# Patient Record
Sex: Female | Born: 1995 | Race: Black or African American | Hispanic: No | Marital: Single | Smoking: Never smoker
Health system: Southern US, Community
[De-identification: ages and names within clinical notes are randomized; demographics above are authoritative.]

## PROBLEM LIST (undated history)

## (undated) ENCOUNTER — Inpatient Hospital Stay (HOSPITAL_COMMUNITY): Payer: Self-pay

## (undated) DIAGNOSIS — Z789 Other specified health status: Secondary | ICD-10-CM

## (undated) HISTORY — PX: NO PAST SURGERIES: SHX2092

---
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
## 2016-10-27 ENCOUNTER — Ambulatory Visit: Payer: Self-pay

## 2016-12-21 NOTE — L&D Delivery Note (Signed)
Delivery Note At  a viable female was delivered via  (Presentation:OA ;  ).  APGAR:9 ,9 ; weight  .   Placenta status:complete , . 3V Cord:  with the following complications:compound presentation, chorioamnionitis,  Anesthesia:  Epidural Episiotomy:  None Lacerations:  2nd vaginal , left labial laceration Suture Repair: 2.0 3.0 vicryl Est. Blood Loss (mL):  2000cc With repeat excessive bleeding noted.  1000cc noted.  Placenta inspected and looked intact.  Dr. Shawnie PonsPratt in to help with repair.  Dr. Shawnie PonsPratt did a exploration of uterus and noted placenta piece with extra debride  that was unable to be obtained.   Pt VSS stable. Sponges and laps weighed and noted to be 2000cc total.  Code hemorrhage called.  Dr. Idamae SchullerVarnardo called to come to hospital.  Anesthesia into dose epidural.   Dr. Levora AngelVanardo in at bedside. Pt consent for D and C.    Mom to OR.  Baby to Couplet care / Skin to Skin.  Michelle Santiago 12/15/2017, 1:44 AM

## 2017-02-20 ENCOUNTER — Emergency Department (HOSPITAL_COMMUNITY)
Admission: EM | Admit: 2017-02-20 | Discharge: 2017-02-20 | Disposition: A | Discharge status: Home / Self Care | Payer: 59 | Attending: Emergency Medicine | Admitting: Emergency Medicine

## 2017-02-20 ENCOUNTER — Encounter (HOSPITAL_COMMUNITY): Payer: Self-pay | Admitting: Emergency Medicine

## 2017-02-20 ENCOUNTER — Emergency Department (HOSPITAL_COMMUNITY): Payer: 59

## 2017-02-20 DIAGNOSIS — Y999 Unspecified external cause status: Secondary | ICD-10-CM | POA: Insufficient documentation

## 2017-02-20 DIAGNOSIS — Y92410 Unspecified street and highway as the place of occurrence of the external cause: Secondary | ICD-10-CM | POA: Diagnosis not present

## 2017-02-20 DIAGNOSIS — S39012A Strain of muscle, fascia and tendon of lower back, initial encounter: Secondary | ICD-10-CM | POA: Diagnosis not present

## 2017-02-20 DIAGNOSIS — Z79899 Other long term (current) drug therapy: Secondary | ICD-10-CM | POA: Diagnosis not present

## 2017-02-20 DIAGNOSIS — Y939 Activity, unspecified: Secondary | ICD-10-CM | POA: Diagnosis not present

## 2017-02-20 DIAGNOSIS — S3992XA Unspecified injury of lower back, initial encounter: Secondary | ICD-10-CM | POA: Diagnosis present

## 2017-02-20 DIAGNOSIS — M25511 Pain in right shoulder: Secondary | ICD-10-CM | POA: Diagnosis not present

## 2017-02-20 LAB — POC URINE PREG, ED: PREG TEST UR: NEGATIVE

## 2017-02-20 MED ORDER — DICLOFENAC SODIUM 50 MG PO TBEC: 50.0000 mg | DELAYED_RELEASE_TABLET | Freq: Two times a day (BID) | ORAL | 0 refills | Status: DC

## 2017-02-20 MED ORDER — CYCLOBENZAPRINE HCL 5 MG PO TABS: 5.0000 mg | ORAL_TABLET | Freq: Three times a day (TID) | ORAL | 0 refills | Status: DC | PRN

## 2017-02-20 MED ORDER — CYCLOBENZAPRINE HCL 10 MG PO TABS
5.0000 mg | ORAL_TABLET | Freq: Once | ORAL | Status: AC
  Administered 2017-02-20: 5 mg via ORAL
  Filled 2017-02-20: qty 1
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
## 2017-02-20 NOTE — ED Notes (Signed)
Pt stable, understands discharge instructions, and reasons for return.   

## 2017-02-20 NOTE — Discharge Instructions (Signed)
Do not drive while taking the muscle relaxant as it will make you sleepy. °
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
## 2017-06-08 LAB — OB RESULTS CONSOLE GC/CHLAMYDIA: Gonorrhea: NEGATIVE

## 2017-06-13 LAB — OB RESULTS CONSOLE GC/CHLAMYDIA: Chlamydia: NEGATIVE

## 2017-08-29 LAB — OB RESULTS CONSOLE RPR: RPR: NONREACTIVE

## 2017-09-02 ENCOUNTER — Other Ambulatory Visit (HOSPITAL_COMMUNITY): Payer: Self-pay | Admitting: Obstetrics and Gynecology

## 2017-09-02 DIAGNOSIS — N883 Incompetence of cervix uteri: Secondary | ICD-10-CM

## 2017-09-02 DIAGNOSIS — Z3A24 24 weeks gestation of pregnancy: Secondary | ICD-10-CM

## 2017-09-02 DIAGNOSIS — Z3689 Encounter for other specified antenatal screening: Secondary | ICD-10-CM

## 2017-09-07 ENCOUNTER — Encounter (HOSPITAL_COMMUNITY): Payer: Self-pay | Admitting: *Deleted

## 2017-09-08 LAB — OB RESULTS CONSOLE RUBELLA ANTIBODY, IGM: RUBELLA: IMMUNE

## 2017-09-09 ENCOUNTER — Other Ambulatory Visit (HOSPITAL_COMMUNITY): Payer: Self-pay | Admitting: *Deleted

## 2017-09-09 ENCOUNTER — Ambulatory Visit (HOSPITAL_COMMUNITY): Admission: RE | Admit: 2017-09-09 | Payer: Medicaid Other | Source: Ambulatory Visit

## 2017-09-09 ENCOUNTER — Ambulatory Visit (HOSPITAL_COMMUNITY)
Admission: RE | Admit: 2017-09-09 | Discharge: 2017-09-09 | Disposition: A | Discharge status: Home / Self Care | Payer: Medicaid Other | Source: Ambulatory Visit | Attending: Obstetrics and Gynecology | Admitting: Obstetrics and Gynecology

## 2017-09-09 ENCOUNTER — Encounter (HOSPITAL_COMMUNITY): Payer: Self-pay

## 2017-09-09 ENCOUNTER — Other Ambulatory Visit (HOSPITAL_COMMUNITY): Payer: Self-pay | Admitting: Obstetrics and Gynecology

## 2017-09-09 DIAGNOSIS — O26872 Cervical shortening, second trimester: Secondary | ICD-10-CM | POA: Diagnosis present

## 2017-09-09 DIAGNOSIS — Z3A25 25 weeks gestation of pregnancy: Secondary | ICD-10-CM | POA: Insufficient documentation

## 2017-09-09 DIAGNOSIS — Z3A24 24 weeks gestation of pregnancy: Secondary | ICD-10-CM

## 2017-09-09 DIAGNOSIS — Z3689 Encounter for other specified antenatal screening: Secondary | ICD-10-CM

## 2017-09-09 DIAGNOSIS — N883 Incompetence of cervix uteri: Secondary | ICD-10-CM

## 2017-09-09 HISTORY — DX: Other specified health status: Z78.9

## 2017-09-16 ENCOUNTER — Encounter (HOSPITAL_COMMUNITY): Payer: Self-pay

## 2017-09-16 ENCOUNTER — Inpatient Hospital Stay (HOSPITAL_COMMUNITY): Payer: Medicaid Other

## 2017-09-16 ENCOUNTER — Inpatient Hospital Stay (HOSPITAL_COMMUNITY)
Admission: AD | Admit: 2017-09-16 | Discharge: 2017-09-16 | Disposition: A | Discharge status: Home / Self Care | Payer: Medicaid Other | Source: Ambulatory Visit | Attending: Obstetrics and Gynecology | Admitting: Obstetrics and Gynecology

## 2017-09-16 DIAGNOSIS — Z3A26 26 weeks gestation of pregnancy: Secondary | ICD-10-CM | POA: Insufficient documentation

## 2017-09-16 DIAGNOSIS — O26872 Cervical shortening, second trimester: Secondary | ICD-10-CM | POA: Diagnosis not present

## 2017-09-16 DIAGNOSIS — Z3686 Encounter for antenatal screening for cervical length: Secondary | ICD-10-CM

## 2017-09-16 LAB — URINALYSIS, ROUTINE W REFLEX MICROSCOPIC
BACTERIA UA: NONE SEEN
BILIRUBIN URINE: NEGATIVE
Glucose, UA: 50 mg/dL — AB
HGB URINE DIPSTICK: NEGATIVE
KETONES UR: NEGATIVE mg/dL
NITRITE: NEGATIVE
Protein, ur: NEGATIVE mg/dL
SPECIFIC GRAVITY, URINE: 1.02 (ref 1.005–1.030)
pH: 5 (ref 5.0–8.0)

## 2017-09-16 NOTE — Discharge Instructions (Signed)
Second Trimester of Pregnancy The second trimester is from week 13 through week 28, month 4 through 6. This is often the time in pregnancy that you feel your best. Often times, morning sickness has lessened or quit. You may have more energy, and you may get hungry more often. Your unborn baby (fetus) is growing rapidly. At the end of the sixth month, he or she is about 9 inches long and weighs about 1 pounds. You will likely feel the baby move (quickening) between 18 and 20 weeks of pregnancy. Follow these instructions at home:  Avoid all smoking, herbs, and alcohol. Avoid drugs not approved by your doctor.  Do not use any tobacco products, including cigarettes, chewing tobacco, and electronic cigarettes. If you need help quitting, ask your doctor. You may get counseling or other support to help you quit.  Only take medicine as told by your doctor. Some medicines are safe and some are not during pregnancy.  Exercise only as told by your doctor. Stop exercising if you start having cramps.  Eat regular, healthy meals.  Wear a good support bra if your breasts are tender.  Do not use hot tubs, steam rooms, or saunas.  Wear your seat belt when driving.  Avoid raw meat, uncooked cheese, and liter boxes and soil used by cats.  Take your prenatal vitamins.  Take 1500-2000 milligrams of calcium daily starting at the 20th week of pregnancy until you deliver your baby.  Try taking medicine that helps you poop (stool softener) as needed, and if your doctor approves. Eat more fiber by eating fresh fruit, vegetables, and whole grains. Drink enough fluids to keep your pee (urine) clear or pale yellow.  Take warm water baths (sitz baths) to soothe pain or discomfort caused by hemorrhoids. Use hemorrhoid cream if your doctor approves.  If you have puffy, bulging veins (varicose veins), wear support hose. Raise (elevate) your feet for 15 minutes, 3-4 times a day. Limit salt in your diet.  Avoid heavy  lifting, wear low heals, and sit up straight.  Rest with your legs raised if you have leg cramps or low back pain.  Visit your dentist if you have not gone during your pregnancy. Use a soft toothbrush to brush your teeth. Be gentle when you floss.  You can have sex (intercourse) unless your doctor tells you not to.  Go to your doctor visits. Get help if:  You feel dizzy.  You have mild cramps or pressure in your lower belly (abdomen).  You have a nagging pain in your belly area.  You continue to feel sick to your stomach (nauseous), throw up (vomit), or have watery poop (diarrhea).  You have bad smelling fluid coming from your vagina.  You have pain with peeing (urination). Get help right away if:  You have a fever.  You are leaking fluid from your vagina.  You have spotting or bleeding from your vagina.  You have severe belly cramping or pain.  You lose or gain weight rapidly.  You have trouble catching your breath and have chest pain.  You notice sudden or extreme puffiness (swelling) of your face, hands, ankles, feet, or legs.  You have not felt the baby move in over an hour.  You have severe headaches that do not go away with medicine.  You have vision changes. This information is not intended to replace advice given to you by your health care provider. Make sure you discuss any questions you have with your health care   provider. Document Released: 03/03/2010 Document Revised: 05/14/2016 Document Reviewed: 02/07/2013 Elsevier Interactive Patient Education  2017 Elsevier Inc.  

## 2017-09-16 NOTE — MAU Note (Signed)
History     CSN: [id number]  Arrival date & time 09/16/17  0002   First Provider Initiated Contact with Patient 09/16/17 0349      No chief complaint on file.   Natalia M Lamping 21 y.o. G1P0 @ [redacted]w[redacted]d presents for pelvic pressure and history of short cervix     Past Medical History:  Diagnosis Date  . Medical history non-contributory     Past Surgical History:  Procedure Laterality Date  . NO PAST SURGERIES      Family History  Problem Relation Age of Onset  . Heart disease Father   . Hypertension Father     Social History  Substance Use Topics  . Smoking status: Never Smoker  . Smokeless tobacco: Never Used  . Alcohol use No    OB History    Gravida Para Term Preterm AB Living   1             SAB TAB Ectopic Multiple Live Births                  Review of Systems  Allergies  Patient has no known allergies.  Home Medications    BP 109/61   Pulse 93   Temp 98.2 F (36.8 C) (Oral)   Resp 18   Ht  (1.753 m)   Wt 136 lb (61.7 kg)   LMP 03/18/2017   SpO2 100%   BMI 20.08 kg/m   Physical Exam  MAU Course  Procedures (including critical care time)  Labs Reviewed  URINALYSIS, ROUTINE W REFLEX MICROSCOPIC - Abnormal; Notable for the following:       Result Value   Glucose, UA 50 (*)    Leukocytes, UA SMALL (*)    Squamous Epithelial / LPF 0-5 (*)    All other components within normal limits   No results found.   1. Encounter for antenatal screening for cervical length       MDM  Dr. Normand Sloop assumed care of pt at 700am

## 2017-09-16 NOTE — MAU Note (Signed)
Patient presents to MAU with c/o of short cervix. Patient is on bedrest and went to movies with boyfriend where she started noticing more pressure in vagina and baby kicking lower. Cervix measured on Monday and was 2cm by [place]. Denies VB, LOF, or discharge. +FM

## 2017-09-17 ENCOUNTER — Ambulatory Visit (HOSPITAL_COMMUNITY)
Admission: RE | Admit: 2017-09-17 | Discharge: 2017-09-17 | Disposition: A | Discharge status: Home / Self Care | Payer: No Typology Code available for payment source | Source: Ambulatory Visit | Attending: Obstetrics and Gynecology | Admitting: Obstetrics and Gynecology

## 2017-09-17 ENCOUNTER — Encounter (HOSPITAL_COMMUNITY): Payer: Self-pay

## 2017-09-23 LAB — OB RESULTS CONSOLE HEPATITIS B SURFACE ANTIGEN: Hepatitis B Surface Ag: NEGATIVE

## 2017-09-23 LAB — OB RESULTS CONSOLE HIV ANTIBODY (ROUTINE TESTING): HIV: NONREACTIVE

## 2017-10-08 ENCOUNTER — Encounter: Payer: Self-pay | Admitting: Obstetrics and Gynecology

## 2017-10-08 DIAGNOSIS — N883 Incompetence of cervix uteri: Secondary | ICD-10-CM | POA: Insufficient documentation

## 2017-10-08 DIAGNOSIS — O43199 Other malformation of placenta, unspecified trimester: Secondary | ICD-10-CM | POA: Insufficient documentation

## 2017-11-26 ENCOUNTER — Inpatient Hospital Stay (HOSPITAL_COMMUNITY)
Admission: AD | Admit: 2017-11-26 | Discharge: 2017-11-27 | Disposition: A | Discharge status: Home / Self Care | Payer: Medicaid Other | Source: Ambulatory Visit | Attending: Obstetrics and Gynecology | Admitting: Obstetrics and Gynecology

## 2017-11-26 ENCOUNTER — Encounter (HOSPITAL_COMMUNITY): Payer: Self-pay | Admitting: *Deleted

## 2017-11-26 DIAGNOSIS — Z3A36 36 weeks gestation of pregnancy: Secondary | ICD-10-CM | POA: Insufficient documentation

## 2017-11-26 DIAGNOSIS — O4703 False labor before 37 completed weeks of gestation, third trimester: Secondary | ICD-10-CM | POA: Insufficient documentation

## 2017-11-26 DIAGNOSIS — O26893 Other specified pregnancy related conditions, third trimester: Secondary | ICD-10-CM | POA: Diagnosis present

## 2017-11-26 LAB — URINALYSIS, ROUTINE W REFLEX MICROSCOPIC
BILIRUBIN URINE: NEGATIVE
Glucose, UA: NEGATIVE mg/dL
Hgb urine dipstick: NEGATIVE
KETONES UR: NEGATIVE mg/dL
LEUKOCYTES UA: NEGATIVE
NITRITE: NEGATIVE
PH: 6 (ref 5.0–8.0)
Protein, ur: NEGATIVE mg/dL
SPECIFIC GRAVITY, URINE: 1.011 (ref 1.005–1.030)

## 2017-11-26 LAB — AMNISURE RUPTURE OF MEMBRANE (ROM) NOT AT ARMC: Amnisure ROM: NEGATIVE

## 2017-11-26 NOTE — MAU Note (Signed)
Pt reports she has been feeling some wetness all day and felt some dripping. Reports some contractions about 8 min apart. Good fetal movement reported.Has been on bedrest for a short cervix.

## 2017-11-27 LAB — OB RESULTS CONSOLE GBS: STREP GROUP B AG: NEGATIVE

## 2017-11-27 NOTE — Discharge Instructions (Signed)

## 2017-11-27 NOTE — MAU Provider Note (Signed)
Chief Complaint:  Rupture of Membranes and Contractions   None    HPI: Michelle Santiago is a 21 y.o. G1P0 at 4088w2d who presents to maternity admissions reporting leaking fluid  States was only a small amount.  No further leaking. FM+  Denies bleeding.  States has had problems with a short cervix during pregnancy and was on progesterone.  Has stopped taking it.  Denies receiving steriods. States having occ stomach cramping.   Pregnancy Course:   Past Medical History:  Diagnosis Date  . Medical history non-contributory    OB History  Gravida Para Term Preterm AB Living  1            SAB TAB Ectopic Multiple Live Births               # Outcome Date GA Lbr Len/2nd Weight Sex Delivery Anes PTL Lv  1 Current              Past Surgical History:  Procedure Laterality Date  . NO PAST SURGERIES     Family History  Problem Relation Age of Onset  . Heart disease Father   . Hypertension Father    Social History   Tobacco Use  . Smoking status: Never Smoker  . Smokeless tobacco: Never Used  Substance Use Topics  . Alcohol use: No  . Drug use: No   No Known Allergies Medications Prior to Admission  Medication Sig Dispense Refill Last Dose  . cyclobenzaprine (FLEXERIL) 5 MG tablet Take 1 tablet (5 mg total) by mouth 3 (three) times daily as needed for muscle spasms. (Patient not taking: Reported on 09/09/2017) 30 tablet 0 Not Taking  . diclofenac (VOLTAREN) 50 MG EC tablet Take 1 tablet (50 mg total) by mouth 2 (two) times daily. (Patient not taking: Reported on 09/09/2017) 15 tablet 0 Not Taking  . Ondansetron HCl (ZOFRAN PO) Take by mouth.   Past Month at Unknown time  . Prenatal Vit-Fe Fumarate-FA (PRENATAL VITAMIN PO) Take by mouth.   09/15/2017 at Unknown time  . PROGESTERONE [facility] vaginally.   Past Week at Unknown time    I have reviewed patient's Past Medical Hx, Surgical Hx, Family Hx, Social Hx, medications and allergies.   ROS:  Review of Systems  Constitutional:  Negative.   HENT: Negative.   Eyes: Negative.   Respiratory: Negative.   Cardiovascular: Negative.   Gastrointestinal: Negative.   Endocrine: Negative.   Genitourinary: Positive for vaginal discharge.  Musculoskeletal: Negative.   Skin: Negative.   Allergic/Immunologic: Negative.   Neurological: Negative.   Hematological: Negative.   Psychiatric/Behavioral: Negative.     Physical Exam   Patient Vitals for the past 24 hrs:  BP Temp Pulse Resp Height Weight  11/26/17 2319 116/74 98.1 F (36.7 C) 96 18 - -  11/26/17 2309 - - - - 5\' 8"  (1.727 m) 67.6 kg (149 lb)   Constitutional: Well-developed, well-nourished female in no acute distress.  Cardiovascular: normal rate Respiratory: normal effort GI: Abd soft, non-tender, gravid appropriate for gestational age. Pos BS x 4 MS: Extremities nontender, no edema, normal ROM Neurologic: Alert and oriented x 4.  GU: Neg CVAT.  Pt refuses vaginal exam FHT:  Baseline 125 , moderate variability, accelerations present, no decelerations Contractions: None   Labs: Results for orders placed or performed during the hospital encounter of 11/26/17 (from the past 24 hour(s))  Urinalysis, Routine w reflex microscopic     Status: None   Collection Time: 11/26/17 11:05  PM  Result Value Ref Range   Color, Urine YELLOW YELLOW   APPearance CLEAR CLEAR   Specific Gravity, Urine 1.011 1.005 - 1.030   pH 6.0 5.0 - 8.0   Glucose, UA NEGATIVE NEGATIVE mg/dL   Hgb urine dipstick NEGATIVE NEGATIVE   Bilirubin Urine NEGATIVE NEGATIVE   Ketones, ur NEGATIVE NEGATIVE mg/dL   Protein, ur NEGATIVE NEGATIVE mg/dL   Nitrite NEGATIVE NEGATIVE   Leukocytes, UA NEGATIVE NEGATIVE  Amnisure rupture of membrane (rom)not at [facility]     Status: None   Collection Time: 11/26/17 11:38 PM  Result Value Ref Range   Amnisure ROM NEGATIVE     Imaging:  No results found.  MAU Course: Orders Placed This Encounter  Procedures  . Amnisure rupture of membrane (rom)not  [facility]  . Urinalysis, Routine w reflex microscopic   No orders of the defined types were placed in this encounter.   MDM: PE exam and labs Assessment: False labor Reactive NST  Plan: Discharge home in stable condition.   Labor precautions and fetal kick counts     Kenney Housemanrothero, Matilynn Dacey Jean, CNM 11/27/2017 12:12 AM

## 2017-11-29 LAB — CULTURE, BETA STREP (GROUP B ONLY)

## 2017-12-14 ENCOUNTER — Encounter (HOSPITAL_COMMUNITY): Payer: Self-pay

## 2017-12-14 ENCOUNTER — Inpatient Hospital Stay (HOSPITAL_COMMUNITY): Payer: Medicaid Other

## 2017-12-14 ENCOUNTER — Inpatient Hospital Stay (HOSPITAL_COMMUNITY)
Admission: AD | Admit: 2017-12-14 | Discharge: 2017-12-16 | DRG: 768 | Disposition: A | Discharge status: Home / Self Care | Payer: Medicaid Other | Source: Ambulatory Visit | Attending: Obstetrics and Gynecology | Admitting: Obstetrics and Gynecology

## 2017-12-14 ENCOUNTER — Inpatient Hospital Stay (HOSPITAL_COMMUNITY): Payer: Medicaid Other | Admitting: Anesthesiology

## 2017-12-14 ENCOUNTER — Encounter (HOSPITAL_COMMUNITY)
Admission: AD | Disposition: A | Discharge status: Home / Self Care | Payer: Self-pay | Source: Ambulatory Visit | Attending: Obstetrics and Gynecology

## 2017-12-14 DIAGNOSIS — Z3A38 38 weeks gestation of pregnancy: Secondary | ICD-10-CM

## 2017-12-14 DIAGNOSIS — O411230 Chorioamnionitis, third trimester, not applicable or unspecified: Principal | ICD-10-CM | POA: Diagnosis present

## 2017-12-14 DIAGNOSIS — O26843 Uterine size-date discrepancy, third trimester: Secondary | ICD-10-CM | POA: Diagnosis present

## 2017-12-14 DIAGNOSIS — O9902 Anemia complicating childbirth: Secondary | ICD-10-CM | POA: Diagnosis present

## 2017-12-14 DIAGNOSIS — Z3483 Encounter for supervision of other normal pregnancy, third trimester: Secondary | ICD-10-CM | POA: Diagnosis present

## 2017-12-14 DIAGNOSIS — O326XX0 Maternal care for compound presentation, not applicable or unspecified: Secondary | ICD-10-CM | POA: Diagnosis present

## 2017-12-14 DIAGNOSIS — O26873 Cervical shortening, third trimester: Secondary | ICD-10-CM | POA: Diagnosis present

## 2017-12-14 DIAGNOSIS — D649 Anemia, unspecified: Secondary | ICD-10-CM | POA: Diagnosis present

## 2017-12-14 HISTORY — PX: DILATION AND CURETTAGE OF UTERUS: SHX78

## 2017-12-14 LAB — ABO/RH: ABO/RH(D): AB POS

## 2017-12-14 LAB — CBC
HCT: 33.4 % — ABNORMAL LOW (ref 36.0–46.0)
HEMATOCRIT: 13 % — AB (ref 36.0–46.0)
HEMOGLOBIN: 10.8 g/dL — AB (ref 12.0–15.0)
Hemoglobin: 4.1 g/dL — CL (ref 12.0–15.0)
MCH: 26.7 pg (ref 26.0–34.0)
MCH: 26.5 pg (ref 26.0–34.0)
MCHC: 32.3 g/dL (ref 30.0–36.0)
MCHC: 31.5 g/dL (ref 30.0–36.0)
MCV: 82.7 fL (ref 78.0–100.0)
MCV: 83.9 fL (ref 78.0–100.0)
PLATELETS: 198 10*3/uL (ref 150–400)
PLATELETS: 97 10*3/uL — AB (ref 150–400)
RBC: 4.04 MIL/uL (ref 3.87–5.11)
RBC: 1.55 MIL/uL — AB (ref 3.87–5.11)
RDW: 14 % (ref 11.5–15.5)
RDW: 14.4 % (ref 11.5–15.5)
WBC: 9.6 10*3/uL (ref 4.0–10.5)
WBC: 7.8 10*3/uL (ref 4.0–10.5)

## 2017-12-14 LAB — POCT FERN TEST: POCT FERN TEST: POSITIVE

## 2017-12-14 LAB — PREPARE RBC (CROSSMATCH)

## 2017-12-14 SURGERY — DILATION AND CURETTAGE
Anesthesia: Monitor Anesthesia Care | Site: Uterus

## 2017-12-14 MED ORDER — OXYTOCIN BOLUS FROM INFUSION
500.0000 mL | Freq: Once | INTRAVENOUS | Status: AC
  Administered 2017-12-14: 500 mL via INTRAVENOUS

## 2017-12-14 MED ORDER — SCOPOLAMINE 1 MG/3DAYS TD PT72
MEDICATED_PATCH | TRANSDERMAL | Status: DC | PRN
  Administered 2017-12-14: 1 via TRANSDERMAL

## 2017-12-14 MED ORDER — MIDAZOLAM HCL 2 MG/2ML IJ SOLN
INTRAMUSCULAR | Status: AC
  Filled 2017-12-14: qty 2

## 2017-12-14 MED ORDER — ACETAMINOPHEN 325 MG PO TABS
325.0000 mg | ORAL_TABLET | Freq: Four times a day (QID) | ORAL | Status: DC | PRN
  Administered 2017-12-14: 325 mg via ORAL
  Filled 2017-12-14: qty 1

## 2017-12-14 MED ORDER — ONDANSETRON HCL 4 MG/2ML IJ SOLN
4.0000 mg | Freq: Four times a day (QID) | INTRAMUSCULAR | Status: DC | PRN
  Administered 2017-12-14: 4 mg via INTRAVENOUS
  Filled 2017-12-14: qty 2

## 2017-12-14 MED ORDER — DEXAMETHASONE SODIUM PHOSPHATE 10 MG/ML IJ SOLN
INTRAMUSCULAR | Status: DC | PRN
  Administered 2017-12-14: 10 mg via INTRAVENOUS

## 2017-12-14 MED ORDER — EPHEDRINE 5 MG/ML INJ: 10.0000 mg | INTRAVENOUS | Status: DC | PRN

## 2017-12-14 MED ORDER — CEFAZOLIN SODIUM-DEXTROSE 2-3 GM-%(50ML) IV SOLR
INTRAVENOUS | Status: AC
  Filled 2017-12-14: qty 50

## 2017-12-14 MED ORDER — METHYLERGONOVINE MALEATE 0.2 MG/ML IJ SOLN
INTRAMUSCULAR | Status: AC
Start: 2017-12-14 — End: 2017-12-14
  Filled 2017-12-14: qty 1

## 2017-12-14 MED ORDER — OXYTOCIN 40 UNITS IN LACTATED RINGERS INFUSION - SIMPLE MED
2.5000 [IU]/h | INTRAVENOUS | Status: DC
  Administered 2017-12-14: 2.5 [IU]/h via INTRAVENOUS
  Filled 2017-12-14: qty 1000

## 2017-12-14 MED ORDER — CEFAZOLIN SODIUM-DEXTROSE 2-3 GM-%(50ML) IV SOLR
INTRAVENOUS | Status: DC | PRN
  Administered 2017-12-14: 2 g via INTRAVENOUS

## 2017-12-14 MED ORDER — HYDROXYZINE HCL 50 MG PO TABS: 50.0000 mg | ORAL_TABLET | Freq: Four times a day (QID) | ORAL | Status: DC | PRN

## 2017-12-14 MED ORDER — LACTATED RINGERS IV SOLN
500.0000 mL | INTRAVENOUS | Status: DC | PRN
  Administered 2017-12-14: 500 mL via INTRAVENOUS

## 2017-12-14 MED ORDER — LIDOCAINE HCL (PF) 1 % IJ SOLN
INTRAMUSCULAR | Status: DC | PRN
  Administered 2017-12-14: 7 mL via EPIDURAL
  Administered 2017-12-14: 4 mL via EPIDURAL

## 2017-12-14 MED ORDER — FENTANYL 2.5 MCG/ML BUPIVACAINE 1/10 % EPIDURAL INFUSION (WH - ANES)
14.0000 mL/h | INTRAMUSCULAR | Status: DC | PRN
  Administered 2017-12-14 (×2): 14 mL/h via EPIDURAL
  Filled 2017-12-14 (×2): qty 100

## 2017-12-14 MED ORDER — OXYCODONE-ACETAMINOPHEN 5-325 MG PO TABS: 1.0000 | ORAL_TABLET | ORAL | Status: DC | PRN

## 2017-12-14 MED ORDER — TERBUTALINE SULFATE 1 MG/ML IJ SOLN: 0.2500 mg | Freq: Once | INTRAMUSCULAR | Status: DC | PRN

## 2017-12-14 MED ORDER — SODIUM CHLORIDE 0.9 % IV SOLN: Freq: Once | INTRAVENOUS | Status: DC

## 2017-12-14 MED ORDER — MIDAZOLAM HCL 2 MG/2ML IJ SOLN
INTRAMUSCULAR | Status: DC | PRN
  Administered 2017-12-14: 2 mg via INTRAVENOUS

## 2017-12-14 MED ORDER — MISOPROSTOL 200 MCG PO TABS
ORAL_TABLET | ORAL | Status: AC
  Filled 2017-12-14: qty 5

## 2017-12-14 MED ORDER — OXYCODONE-ACETAMINOPHEN 5-325 MG PO TABS: 2.0000 | ORAL_TABLET | ORAL | Status: DC | PRN

## 2017-12-14 MED ORDER — LACTATED RINGERS IV SOLN
500.0000 mL | Freq: Once | INTRAVENOUS | Status: AC
  Administered 2017-12-14 (×2): via INTRAVENOUS

## 2017-12-14 MED ORDER — DIPHENHYDRAMINE HCL 50 MG/ML IJ SOLN: 12.5000 mg | INTRAMUSCULAR | Status: DC | PRN

## 2017-12-14 MED ORDER — PHENYLEPHRINE 40 MCG/ML (10ML) SYRINGE FOR IV PUSH (FOR BLOOD PRESSURE SUPPORT)
80.0000 ug | PREFILLED_SYRINGE | INTRAVENOUS | Status: DC | PRN
  Filled 2017-12-14: qty 10

## 2017-12-14 MED ORDER — METHYLERGONOVINE MALEATE 0.2 MG/ML IJ SOLN
INTRAMUSCULAR | Status: DC | PRN
  Administered 2017-12-14: 0.2 mg via INTRAMUSCULAR

## 2017-12-14 MED ORDER — LIDOCAINE HCL (PF) 1 % IJ SOLN
30.0000 mL | INTRAMUSCULAR | Status: DC | PRN
  Administered 2017-12-14: 30 mL via SUBCUTANEOUS
  Filled 2017-12-14 (×2): qty 30

## 2017-12-14 MED ORDER — SODIUM BICARBONATE 8.4 % IV SOLN
INTRAVENOUS | Status: DC | PRN
  Administered 2017-12-14: 3 mL via EPIDURAL
  Administered 2017-12-14: 4 mL via EPIDURAL
  Administered 2017-12-14: 3 mL via EPIDURAL

## 2017-12-14 MED ORDER — LACTATED RINGERS IV SOLN
INTRAVENOUS | Status: DC
  Administered 2017-12-14 (×3): via INTRAVENOUS

## 2017-12-14 MED ORDER — SODIUM CHLORIDE 0.9 % IV SOLN
3.0000 g | Freq: Four times a day (QID) | INTRAVENOUS | Status: DC
  Administered 2017-12-14: 3 g via INTRAVENOUS
  Filled 2017-12-14 (×4): qty 3

## 2017-12-14 MED ORDER — ONDANSETRON HCL 4 MG/2ML IJ SOLN
INTRAMUSCULAR | Status: DC | PRN
  Administered 2017-12-14: 4 mg via INTRAVENOUS

## 2017-12-14 MED ORDER — SOD CITRATE-CITRIC ACID 500-334 MG/5ML PO SOLN: 30.0000 mL | ORAL | Status: DC | PRN

## 2017-12-14 MED ORDER — FENTANYL CITRATE (PF) 100 MCG/2ML IJ SOLN
INTRAMUSCULAR | Status: AC
  Filled 2017-12-14: qty 2

## 2017-12-14 MED ORDER — ACETAMINOPHEN 325 MG PO TABS
650.0000 mg | ORAL_TABLET | ORAL | Status: DC | PRN
  Administered 2017-12-14 – 2017-12-15 (×2): 650 mg via ORAL
  Filled 2017-12-14: qty 2

## 2017-12-14 MED ORDER — OXYTOCIN 40 UNITS IN LACTATED RINGERS INFUSION - SIMPLE MED
1.0000 m[IU]/min | INTRAVENOUS | Status: DC
  Administered 2017-12-14: 2 m[IU]/min via INTRAVENOUS

## 2017-12-14 MED ORDER — FENTANYL CITRATE (PF) 100 MCG/2ML IJ SOLN
INTRAMUSCULAR | Status: DC | PRN
  Administered 2017-12-14: 100 ug via INTRAVENOUS

## 2017-12-14 MED ORDER — BUTORPHANOL TARTRATE 1 MG/ML IJ SOLN: 1.0000 mg | INTRAMUSCULAR | Status: DC | PRN

## 2017-12-14 SURGICAL SUPPLY — 26 items
BALLN POSTPARTUM SOS BAKRI (BALLOONS) ×3
BALLOON POSTPARTUM SOS BAKRI (BALLOONS) IMPLANT
BNDG GAUZE ELAST 4 BULKY (GAUZE/BANDAGES/DRESSINGS) ×2 IMPLANT
CATH ROBINSON RED A/P 16FR (CATHETERS) ×3 IMPLANT
CLOTH BEACON ORANGE TIMEOUT ST (SAFETY) ×3 IMPLANT
CONTAINER PREFILL 10% NBF 60ML (FORM) IMPLANT
DECANTER SPIKE VIAL GLASS SM (MISCELLANEOUS) ×3 IMPLANT
DILATOR CANAL MILEX (MISCELLANEOUS) IMPLANT
GAUZE SPONGE 4X4 16PLY XRAY LF (GAUZE/BANDAGES/DRESSINGS) ×4 IMPLANT
GLOVE BIO SURGEON STRL SZ7 (GLOVE) ×3 IMPLANT
GLOVE BIOGEL PI IND STRL 7.0 (GLOVE) ×3 IMPLANT
GLOVE BIOGEL PI INDICATOR 7.0 (GLOVE) ×6
GOWN STRL REUS W/TWL LRG LVL3 (GOWN DISPOSABLE) ×6 IMPLANT
NS IRRIG 1000ML POUR BTL (IV SOLUTION) ×3 IMPLANT
PACK VAGINAL MINOR WOMEN LF (CUSTOM PROCEDURE TRAY) ×3 IMPLANT
PAD OB MATERNITY 4.3X12.25 (PERSONAL CARE ITEMS) ×3 IMPLANT
PAD PREP 24X48 CUFFED NSTRL (MISCELLANEOUS) ×3 IMPLANT
SET BERKELEY SUCTION TUBING (SUCTIONS) ×2 IMPLANT
SPONGE LAP 4X18 X RAY DECT (DISPOSABLE) ×4 IMPLANT
SPONGE SURGIFOAM ABS GEL 12-7 (HEMOSTASIS) ×2 IMPLANT
SUT CHROMIC 2 0 CT 1 (SUTURE) ×2 IMPLANT
SUT VIC AB 3-0 CT1 27 (SUTURE) ×6
SUT VIC AB 3-0 CT1 TAPERPNT 27 (SUTURE) IMPLANT
TOWEL OR 17X24 6PK STRL BLUE (TOWEL DISPOSABLE) ×6 IMPLANT
TRAY FOLEY CATH SILVER 14FR (SET/KITS/TRAYS/PACK) ×2 IMPLANT
YANKAUER SUCT BULB TIP NO VENT (SUCTIONS) ×2 IMPLANT

## 2017-12-14 NOTE — Anesthesia Pain Management Evaluation Note (Signed)
  [facility] Pain Management Visit Note  Patient: Michelle Santiago, 21 y.o., female  "Hello I am a member of the anesthesia team at [facility]. We have an anesthesia team available at all times to provide care throughout the hospital, including epidural management and anesthesia for C-section. I don't know your plan for the delivery whether it a natural birth, water birth, IV sedation, nitrous supplementation, doula or epidural, but we want to meet your pain goals."   1.Was your pain managed to your expectations on prior hospitalizations?   No prior hospitalizations  2.What is your expectation for pain management during this hospitalization?     Epidural  3.How can we help you reach that goal?   Record the patient's initial score and the patient's pain goal.   Pain: 3  Pain Goal: 2 [facility] wants you to be able to say your pain was always managed very well.  Laban EmperorMalinova,Dashauna Heymann Hristova 12/14/2017

## 2017-12-14 NOTE — Anesthesia Preprocedure Evaluation (Signed)
Anesthesia Evaluation  Patient identified by MRN, date of birth, ID band Patient awake    Reviewed: Allergy & Precautions, H&P , NPO status , Patient's Chart, lab work & pertinent test results  Airway Mallampati: I  TM Distance: >3 FB Neck ROM: full    Dental no notable dental hx. (+) Teeth Intact   Pulmonary neg pulmonary ROS,    Pulmonary exam normal breath sounds clear to auscultation       Cardiovascular negative cardio ROS Normal cardiovascular exam Rhythm:regular Rate:Normal     Neuro/Psych negative neurological ROS  negative psych ROS   GI/Hepatic negative GI ROS, Neg liver ROS,   Endo/Other  negative endocrine ROS  Renal/GU negative Renal ROS     Musculoskeletal negative musculoskeletal ROS (+)   Abdominal Normal abdominal exam  (+)   Peds  Hematology  (+) Blood dyscrasia, anemia ,   Anesthesia Other Findings   Reproductive/Obstetrics (+) Pregnancy                             Anesthesia Physical  Anesthesia Plan  ASA: II and emergent  Anesthesia Plan: Epidural and MAC   Post-op Pain Management:    Induction:   PONV Risk Score and Plan: 2 and Ondansetron, Dexamethasone and Scopolamine patch - Pre-op  Airway Management Planned: Natural Airway and Nasal Cannula  Additional Equipment:   Intra-op Plan:   Post-operative Plan:   Informed Consent: I have reviewed the patients History and Physical, chart, labs and discussed the procedure including the risks, benefits and alternatives for the proposed anesthesia with the patient or authorized representative who has indicated his/her understanding and acceptance.     Plan Discussed with: CRNA and Surgeon  Anesthesia Plan Comments:         Anesthesia Quick Evaluation

## 2017-12-14 NOTE — Anesthesia Preprocedure Evaluation (Signed)
Anesthesia Evaluation  Patient identified by MRN, date of birth, ID band Patient awake    Reviewed: Allergy & Precautions, H&P , NPO status , Patient's Chart, lab work & pertinent test results  Airway Mallampati: I  TM Distance: >3 FB Neck ROM: full    Dental no notable dental hx. (+) Teeth Intact   Pulmonary neg pulmonary ROS,    Pulmonary exam normal breath sounds clear to auscultation       Cardiovascular negative cardio ROS Normal cardiovascular exam Rhythm:regular Rate:Normal     Neuro/Psych negative neurological ROS  negative psych ROS   GI/Hepatic negative GI ROS, Neg liver ROS,   Endo/Other  negative endocrine ROS  Renal/GU negative Renal ROS     Musculoskeletal negative musculoskeletal ROS (+)   Abdominal Normal abdominal exam  (+)   Peds  Hematology  (+) Blood dyscrasia, anemia ,   Anesthesia Other Findings   Reproductive/Obstetrics (+) Pregnancy                             Anesthesia Physical Anesthesia Plan  ASA: II  Anesthesia Plan: Epidural   Post-op Pain Management:    Induction:   PONV Risk Score and Plan:   Airway Management Planned:   Additional Equipment:   Intra-op Plan:   Post-operative Plan:   Informed Consent: I have reviewed the patients History and Physical, chart, labs and discussed the procedure including the risks, benefits and alternatives for the proposed anesthesia with the patient or authorized representative who has indicated his/her understanding and acceptance.       Plan Discussed with:   Anesthesia Plan Comments:         Anesthesia Quick Evaluation  

## 2017-12-14 NOTE — Anesthesia Procedure Notes (Signed)
Epidural Patient location during procedure: OB Start time: 12/14/2017 11:54 AM End time: 12/14/2017 11:57 AM  Staffing Anesthesiologist: Leilani AbleHatchett, Kennice Finnie, MD Performed: anesthesiologist   Preanesthetic Checklist Completed: patient identified, surgical consent, pre-op evaluation, timeout performed, IV checked, risks and benefits discussed and monitors and equipment checked  Epidural Patient position: sitting Prep: site prepped and draped and DuraPrep Patient monitoring: continuous pulse ox and blood pressure Approach: midline Location: L3-L4 Injection technique: LOR air  Needle:  Needle type: Tuohy  Needle gauge: 17 G Needle length: 9 cm and 9 Needle insertion depth: 5 cm cm Catheter type: closed end flexible Catheter size: 19 Gauge Catheter at skin depth: 10 cm Test dose: negative and Other  Assessment Sensory level: T9 Events: blood not aspirated, injection not painful, no injection resistance, negative IV test and no paresthesia  Additional Notes Reason for block:procedure for pain

## 2017-12-14 NOTE — MAU Note (Signed)
Pt started having ctx this morning, some fluid leaking, no bleeding.

## 2017-12-14 NOTE — H&P (Addendum)
Michelle Santiago is a 21 y.o. female G1 @ 6238 5/7 weeks presenting for labor.  Cervix was 4-5 cm on admission.  Membranes ruptured at 10:15 am clear prior to pt's arrival. OB care has been provided by [facility].    Complications as below: Short cervix, was on 17 P. Accessory lobe to placenta. Size less than dates,  EFW @ 50+% at 37 weeks. OB History    Gravida Para Term Preterm AB Living   1             SAB TAB Ectopic Multiple Live Births                 Past Medical History:  Diagnosis Date  . Medical history non-contributory    Past Surgical History:  Procedure Laterality Date  . NO PAST SURGERIES     Family History: family history includes Heart disease in her father; Hypertension in her father. Social History:  reports that  has never smoked. she has never used smokeless tobacco. She reports that she does not drink alcohol or use drugs.     Maternal Diabetes: No Genetic Screening: Normal Maternal Ultrasounds/Referrals: Normal Fetal Ultrasounds or other Referrals:  None Maternal Substance Abuse:  No Significant Maternal Medications:  Meds include: Other: 17 P Significant Maternal Lab Results:  Lab values include: Group B Strep negative Other Comments:  None  Review of Systems  Constitutional: Positive for chills and fever.  Gastrointestinal: Negative for abdominal pain.  Skin: Negative for rash.   Maternal Medical History:  Reason for admission: Rupture of membranes and contractions.   Contractions: Onset was 13-24 hours ago.   Frequency: regular.   Perceived severity is moderate.    Fetal activity: Perceived fetal activity is normal.    Prenatal complications: Short cervix, was on 17 P. Accessory lobe to placenta. Size less than dates,  EFW @ 50+% at 37 weeks.  Prenatal Complications - Diabetes: none.    Dilation: Lip/rim Effacement (%): 100 Station: +1 Exam by:: Cher NakaiKristin McLeod RN Blood pressure 101/64, pulse 100, temperature (!) 100.7 F (38.2 C),  temperature source Axillary, resp. rate 18, height 5\' 8"  (1.727 m), weight 68.5 kg (151 lb), last menstrual period 03/18/2017, SpO2 100 %. Maternal Exam:  Uterine Assessment: Contraction strength is moderate.  Contraction frequency is regular.  IUPC placed to assess contractions due to protracted labor.  IUPC first contraction 25 mVUs  Abdomen: Patient reports no abdominal tenderness. Estimated fetal weight is 7 lbs.   Fetal presentation: vertex  Introitus: Normal vulva. Normal vagina.  Amniotic fluid character: clear.  Pelvis: adequate for delivery.   Cervix: Cervix evaluated by digital exam.   Rim of cervix from 7-12 pm. Forebag AROM'd clear  Fetal Exam Fetal Monitor Review: Mode: fetoscope.   Baseline rate: 15-s.  Variability: moderate (6-25 bpm).   Pattern: accelerations present.   Baseline heart rate increased from 130s with low grade fever.  Fetal State Assessment: Category I - tracings are normal.     Physical Exam  Constitutional: She is oriented to person, place, and time. She appears well-developed and well-nourished. No distress.  HENT:  Head: Normocephalic and atraumatic.  Eyes: EOM are normal.  Neck: Normal range of motion.  Cardiovascular: Normal rate and regular rhythm.  Respiratory: Effort normal and breath sounds normal. No respiratory distress.  GI: There is no tenderness.  Musculoskeletal: Normal range of motion. She exhibits no edema.  Neurological: She is alert and oriented to person, place, and time.  Skin: Skin  is warm and dry.  Psychiatric: She has a normal mood and affect.    Prenatal labs: ABO, Rh: --/--/AB POS (12/25 1125) Antibody: NEG (12/25 1120) Rubella: Immune (09/19 0000) RPR: Nonreactive (09/09 0000)  HBsAg: Negative (10/04 0000)  HIV: Non-reactive (10/04 0000)  GBS: Negative (12/08 0000)   Assessment/Plan: IUP @ 38 5/7 weeks Cat I tracing Low grade Fever.  Tylenol for fever.  I anticipate delivery soon.  If fever persists, start  Unasyn. Protracted labor due to inadequate contractions.  Pitocin 2x2 started.  IUPC in place.  Accessory Lobe to placenta.  CNM informed. Epidural in place. Anticipate vaginal delivery. Lajuan LinesNancy Pethero, CNM to assume care @ 7 pm has been updated on pt's status.   Michelle Santiago 12/14/2017, 6:10 PM

## 2017-12-15 ENCOUNTER — Encounter (HOSPITAL_COMMUNITY): Payer: Self-pay | Admitting: Obstetrics and Gynecology

## 2017-12-15 LAB — CBC
HCT: 29 % — ABNORMAL LOW (ref 36.0–46.0)
HCT: 29.2 % — ABNORMAL LOW (ref 36.0–46.0)
HEMOGLOBIN: 10.1 g/dL — AB (ref 12.0–15.0)
Hemoglobin: 9.6 g/dL — ABNORMAL LOW (ref 12.0–15.0)
MCH: 28 pg (ref 26.0–34.0)
MCH: 28.2 pg (ref 26.0–34.0)
MCHC: 33.1 g/dL (ref 30.0–36.0)
MCHC: 34.6 g/dL (ref 30.0–36.0)
MCV: 84.5 fL (ref 78.0–100.0)
MCV: 81.6 fL (ref 78.0–100.0)
PLATELETS: 132 10*3/uL — AB (ref 150–400)
Platelets: 152 10*3/uL (ref 150–400)
RBC: 3.43 MIL/uL — AB (ref 3.87–5.11)
RBC: 3.58 MIL/uL — AB (ref 3.87–5.11)
RDW: 14.1 % (ref 11.5–15.5)
RDW: 14.1 % (ref 11.5–15.5)
WBC: 19.9 10*3/uL — AB (ref 4.0–10.5)
WBC: 24.6 10*3/uL — ABNORMAL HIGH (ref 4.0–10.5)

## 2017-12-15 LAB — PREPARE RBC (CROSSMATCH)

## 2017-12-15 MED ORDER — SCOPOLAMINE 1 MG/3DAYS TD PT72
MEDICATED_PATCH | TRANSDERMAL | Status: AC
Start: 2017-12-15 — End: ?
  Filled 2017-12-15: qty 1

## 2017-12-15 MED ORDER — POLYSACCHARIDE IRON COMPLEX 150 MG PO CAPS
150.0000 mg | ORAL_CAPSULE | Freq: Every day | ORAL | Status: DC
  Administered 2017-12-15: 150 mg via ORAL
  Filled 2017-12-15 (×2): qty 1

## 2017-12-15 MED ORDER — WITCH HAZEL-GLYCERIN EX PADS: 1.0000 "application " | MEDICATED_PAD | CUTANEOUS | Status: DC | PRN

## 2017-12-15 MED ORDER — TETANUS-DIPHTH-ACELL PERTUSSIS 5-2.5-18.5 LF-MCG/0.5 IM SUSP: 0.5000 mL | Freq: Once | INTRAMUSCULAR | Status: DC

## 2017-12-15 MED ORDER — SIMETHICONE 80 MG PO CHEW: 80.0000 mg | CHEWABLE_TABLET | ORAL | Status: DC | PRN

## 2017-12-15 MED ORDER — MEPERIDINE HCL 25 MG/ML IJ SOLN
INTRAMUSCULAR | Status: AC
  Filled 2017-12-15: qty 1

## 2017-12-15 MED ORDER — HYDROMORPHONE HCL 1 MG/ML IJ SOLN: 0.2500 mg | INTRAMUSCULAR | Status: DC | PRN

## 2017-12-15 MED ORDER — ACETAMINOPHEN 10 MG/ML IV SOLN: 1000.0000 mg | Freq: Once | INTRAVENOUS | Status: DC | PRN

## 2017-12-15 MED ORDER — ACETAMINOPHEN 325 MG PO TABS
ORAL_TABLET | ORAL | Status: AC
  Filled 2017-12-15: qty 2

## 2017-12-15 MED ORDER — SODIUM CHLORIDE 0.9 % IV SOLN
INTRAVENOUS | Status: DC | PRN
  Administered 2017-12-14: via INTRAVENOUS

## 2017-12-15 MED ORDER — MEPERIDINE HCL 25 MG/ML IJ SOLN
INTRAMUSCULAR | Status: DC | PRN
  Administered 2017-12-15 (×2): 12.5 mg via INTRAVENOUS

## 2017-12-15 MED ORDER — SENNOSIDES-DOCUSATE SODIUM 8.6-50 MG PO TABS
2.0000 | ORAL_TABLET | ORAL | Status: DC
  Administered 2017-12-15: 2 via ORAL
  Filled 2017-12-15: qty 2

## 2017-12-15 MED ORDER — LACTATED RINGERS IV SOLN
INTRAVENOUS | Status: DC | PRN
  Administered 2017-12-14: via INTRAVENOUS

## 2017-12-15 MED ORDER — METHYLERGONOVINE MALEATE 0.2 MG/ML IJ SOLN: 0.2000 mg | Freq: Four times a day (QID) | INTRAMUSCULAR | Status: AC

## 2017-12-15 MED ORDER — OXYCODONE-ACETAMINOPHEN 5-325 MG PO TABS: 1.0000 | ORAL_TABLET | ORAL | Status: DC | PRN

## 2017-12-15 MED ORDER — METRONIDAZOLE IN NACL 5-0.79 MG/ML-% IV SOLN
500.0000 mg | Freq: Three times a day (TID) | INTRAVENOUS | Status: DC
  Administered 2017-12-15 (×2): 500 mg via INTRAVENOUS
  Filled 2017-12-15 (×4): qty 100

## 2017-12-15 MED ORDER — MISOPROSTOL 100 MCG PO TABS
ORAL_TABLET | ORAL | Status: DC | PRN
  Administered 2017-12-14: 1000 ug via RECTAL

## 2017-12-15 MED ORDER — METHYLERGONOVINE MALEATE 0.2 MG PO TABS
0.2000 mg | ORAL_TABLET | Freq: Four times a day (QID) | ORAL | Status: AC
  Administered 2017-12-15 – 2017-12-16 (×4): 0.2 mg via ORAL
  Filled 2017-12-15 (×4): qty 1

## 2017-12-15 MED ORDER — DIBUCAINE 1 % RE OINT: 1.0000 "application " | TOPICAL_OINTMENT | RECTAL | Status: DC | PRN

## 2017-12-15 MED ORDER — SODIUM CHLORIDE 0.9 % IV SOLN: Freq: Once | INTRAVENOUS | Status: DC

## 2017-12-15 MED ORDER — SODIUM CHLORIDE 0.9 % IV SOLN
3.0000 g | Freq: Four times a day (QID) | INTRAVENOUS | Status: AC
  Administered 2017-12-15 (×3): 3 g via INTRAVENOUS
  Filled 2017-12-15 (×3): qty 3

## 2017-12-15 MED ORDER — ONDANSETRON HCL 4 MG PO TABS: 4.0000 mg | ORAL_TABLET | ORAL | Status: DC | PRN

## 2017-12-15 MED ORDER — METHYLERGONOVINE MALEATE 0.2 MG/ML IJ SOLN
INTRAMUSCULAR | Status: DC | PRN
  Administered 2017-12-14: 0.2 mg via INTRAMUSCULAR

## 2017-12-15 MED ORDER — IBUPROFEN 600 MG PO TABS
600.0000 mg | ORAL_TABLET | Freq: Four times a day (QID) | ORAL | Status: DC
  Administered 2017-12-15 – 2017-12-16 (×4): 600 mg via ORAL
  Filled 2017-12-15 (×4): qty 1

## 2017-12-15 MED ORDER — PROMETHAZINE HCL 25 MG/ML IJ SOLN: 6.2500 mg | INTRAMUSCULAR | Status: DC | PRN

## 2017-12-15 MED ORDER — ONDANSETRON HCL 4 MG/2ML IJ SOLN: 4.0000 mg | INTRAMUSCULAR | Status: DC | PRN

## 2017-12-15 MED ORDER — COCONUT OIL OIL: 1.0000 "application " | TOPICAL_OIL | Status: DC | PRN

## 2017-12-15 MED ORDER — MEPERIDINE HCL 25 MG/ML IJ SOLN: 6.2500 mg | INTRAMUSCULAR | Status: DC | PRN

## 2017-12-15 MED ORDER — DIPHENHYDRAMINE HCL 25 MG PO CAPS: 25.0000 mg | ORAL_CAPSULE | Freq: Four times a day (QID) | ORAL | Status: DC | PRN

## 2017-12-15 MED ORDER — BENZOCAINE-MENTHOL 20-0.5 % EX AERO
1.0000 "application " | INHALATION_SPRAY | CUTANEOUS | Status: DC | PRN
  Administered 2017-12-15: 1 via TOPICAL
  Filled 2017-12-15: qty 56

## 2017-12-15 MED ORDER — MAGNESIUM HYDROXIDE 400 MG/5ML PO SUSP: 30.0000 mL | ORAL | Status: DC | PRN

## 2017-12-15 MED ORDER — OXYCODONE-ACETAMINOPHEN 5-325 MG PO TABS: 2.0000 | ORAL_TABLET | ORAL | Status: DC | PRN

## 2017-12-15 MED ORDER — ACETAMINOPHEN 325 MG PO TABS: 650.0000 mg | ORAL_TABLET | ORAL | Status: DC | PRN

## 2017-12-15 MED ORDER — ZOLPIDEM TARTRATE 5 MG PO TABS: 5.0000 mg | ORAL_TABLET | Freq: Every evening | ORAL | Status: DC | PRN

## 2017-12-15 MED ORDER — PRENATAL MULTIVITAMIN CH
1.0000 | ORAL_TABLET | Freq: Every day | ORAL | Status: DC
  Administered 2017-12-15: 1 via ORAL
  Filled 2017-12-15: qty 1

## 2017-12-15 NOTE — Progress Notes (Signed)
Subjective: Postpartum Day 1: Vaginal delivery, 2nd degree vaginal with labial laceration post PPH with D and C.   Chorio  Patient up ad lib, reports no syncope or dizziness.  Pain controlled.  Foley draining. Feeding: breast Contraceptive plan: undecided  Objective: Vital signs in last 24 hours: Temp:  [97.7 F (36.5 C)-101.7 F (38.7 C)] 97.7 F (36.5 C) (12/26 0456) Pulse Rate:  [70-118] 82 (12/26 0456) Resp:  [13-24] 16 (12/26 0456) BP: (69-145)/(27-115) 125/77 (12/26 0456) SpO2:  [97 %-100 %] 100 % (12/26 0456) Weight:  [68.5 kg (151 lb)] 68.5 kg (151 lb) (12/25 1123)  Physical Exam:  General: alert, cooperative and no distress Lochia: appropriate Uterine Fundus: firm sm rubra Perineum: packing removed, scant blood noted.  DVT Evaluation: No evidence of DVT seen on physical exam. Negative Homan's sign.   CBC Latest Ref Rng & Units 12/15/2017 12/14/2017 12/14/2017  WBC 4.0 - 10.5 K/uL 19.9(H) 7.8 9.6  Hemoglobin 12.0 - 15.0 g/dL 0.9(W9.6(L) 4.1(LL) 10.8(L)  Hematocrit 36.0 - 46.0 % 29.0(L) 13.0(L) 33.4(L)  Platelets 150 - 400 K/uL 132(L) 97(L) 198     Assessment/Plan: Status post vaginal delivery day 1.   Hold fourth unit of PRBC Pt afebrile, will continue unasyn x 24 hours.  Done at 2100. Stable Breastfeeding and Lactation consult    Henderson Newcomerancy Jean ProtheroCNM 12/15/2017, 7:44 AM

## 2017-12-15 NOTE — Transfer of Care (Signed)
Immediate Anesthesia Transfer of Care Note  Patient: Michelle Santiago  Procedure(s) Performed: DILATATION AND CURETTAGE (N/A Uterus)  Patient Location: PACU  Anesthesia Type:Epidural  Level of Consciousness: awake and alert   Airway & Oxygen Therapy: Patient Spontanous Breathing  Post-op Assessment: Report given to RN and Post -op Vital signs reviewed and stable  Post vital signs: Reviewed and stable  Last Vitals:  Vitals:   12/15/17 0230 12/15/17 0257  BP: 121/83 121/74  Pulse: 72 80  Resp: 13 18  Temp: 36.7 C 37 C  SpO2: 99% 100%    Last Pain:  Vitals:   12/15/17 0230  TempSrc:   PainSc: 0-No pain         Complications: No apparent anesthesia complications

## 2017-12-15 NOTE — Progress Notes (Signed)
Dr. Malen GauzeFoster notified of patient status and morning platelet results at 152. Per MD epidural cath may be removed.

## 2017-12-15 NOTE — Op Note (Signed)
NAME:  Michelle Santiago, Jeronica                 ACCOUNT NO.:  MEDICAL RECORD NO.:  [id number]  LOCATION:                                 FACILITY:  PHYSICIAN:  Pieter PartridgeEvelyn B Lucy Boardman, MD   DATE OF BIRTH:  Jan 10, 1996  DATE OF PROCEDURE: DATE OF DISCHARGE:                              OPERATIVE REPORT   PREOPERATIVE DIAGNOSIS:  Postpartum hemorrhage with retained placenta and presumed chorioamnionitis.  POSTOPERATIVE DIAGNOSIS:  Postpartum hemorrhage with retained placenta and presumed chorioamnionitis, severe anemia.  PROCEDURE PERFORMED:  Postpartum curettage under ultrasound guidance and Bakri balloon placement.  SURGEON:  Pieter PartridgeEvelyn B Sianna Garofano, MD.  ASSISTANT:  Bernerd PhoNancy Prothero, certified nurse midwife.  ANESTHESIA:  Epidural.  ESTIMATED BLOOD LOSS:  498.  URINE OUTPUT:  900.  BLOOD ADMINISTERED:  2 units of packed red blood cells.  DRAINS:  Urinary catheter and Bakri balloon placed, but expelled.  LOCAL:  None.  SPECIMEN:  Retained placenta.  DISPOSITION OF SPECIMENS:  To pathology.  PATIENT DISPOSITION:  To PACU, hemodynamically stable.  COMPLICATIONS:  None.  FINDINGS:  Thin endometrial stripe, however, the patient had a clot in the lower uterine segment, possible placental tissue in the lower uterine segment, minimal.  Cervix without laceration and friable vaginal mucosa, hemorrhoids.  INDICATIONS:  Ms. Michelle Santiago is a 21 year old, gravida 1, who came in an active labor at 3138 weeks and 5.  She was ruptured approximately 8 hours prior to delivery.  She had protracted labor, had a forebag ruptured. An IUPC placed which showed inadequate contractions.  Pitocin was started at that time.  She developed a fever and was subsequently treated with Ancef prior to delivery.  At the time of delivery, she pushed for 14 minutes and delivered with compound presentation.  She had a few small vaginal lacerations, but just several abrasions.  Placenta appeared to deliver intact.  It was known  that she had an accessory lobe of her placenta.  The placenta appeared heart-shaped and trailing membranes were noted.  There were no vessels extending to the edge of the membranes.  However, the patient continued to have significant bleeding and clots.  The portion of the placenta was removed and it was felt that there was more remaining inside and the patient was not comfortable, but the epidural was optimized.  The patient still was unable to tolerate instrumentation or evacuation of the remaining placental tissue.  Postpartum hemorrhage protocol was initiated prior to going back to the OR.  It was suspected that the patient had lost 2000 mL of blood.  She became hypotensive.  Upon my arrival, it had decided that the patient was not comfortable and she developed hypotension D&C under ultrasound guidance was recommended.  The patient was consented and agreed to the procedure and transfusion of blood as needed.  Her starting hemoglobin was 10.8.  PROCEDURE IN DETAIL:  Ms. Michelle Santiago was taken back to OR #2.  She was placed in the dorsal lithotomy position.  Her epidural was dosed.  She was not receiving any IV anesthesia.  She received Ancef 2 g IV.  SCDs were on her legs and operating.  A time-out was performed.  Discussing EBL prior to that,  2 units of blood were en route and hemoglobin was pending.  A Graves speculum was inserted into the vagina.  Vaginal labial laceration on the left was noted, but did not appear to be actively bleeding.  Bright red blood was pooling in the vagina.  A clot was removed and noted.  Ultrasound was at the bedside and the stripe surprisingly appeared thin.  There was no obvious retained placental tissue.  However, I did place a curette and there was an echogenic area near the lower uterine segment that I gently scraped and the appearance was consistent with possible placental tissue.  Once that was performed, the bleeding did seem to slow, but she was  still having some oozing.  At that point, her hemoglobin had returned of 4.6, so in that she continues to bleed.  We decided to place a Bakri Balloon while we were waiting and that I felt like all of the uterus had been cleared.  She had Cytotec 1000 per rectum and also got Methergine.  Her bladder was full on ultrasound, so a Foley catheter was placed under sterile technique.  We then got the Bakri balloon and we had to get the correct stylet.  The Bakri balloon was placed under ultrasound guidance at the fundus.  When I attempted to remove the stylet, the balloon became displaced. Eventually, we would get the balloon at the fundus and approximately 5 to 10 minutes later it spontaneously expelled into the lower uterine segment and the bleeding seemed to increase.  The Bakri balloon was then deflated and removed.  It had 50 mL in it. The bleeding seemed to improve once that was removed.  The lacerations were then repaired on the left side and the labial with 2-0 chromic and 3-0 Vicryl.   Bleeding on the labia resumed after balloon removed and another 3-0 Vicryl was thrown for hemostasis.  Pressure was applied while we were waiting for Kerlix roll and that seem to help.  There was a clot in the posterior vagina at the opening of the cervix and that was removed.  There was no active bleeding.  Pitocin IV had also been going once the uterus was cleared.  The fundus was really firm.  Due to the low hemoglobin and suspected coagulopathy potentially, I put Gelfoam in the vagina just because there were several abrasions and it seemed to be to oozy and then I placed a moistened Kerlix roll in for tamponade and that Bakri balloon had been expelled and that the tamponade seemed to improve the bleeding.  The patient was awakened and asking questions during the procedure.  All instrument, sponge, and needle counts were correct x3.  The patient tolerated the procedure very well.  She was comfortable  throughout the entire procedure.  She received 2 units of PRBCs while in the operating room and the plan, as we were leaving, was to give another 2 units in the PACU and check coags to determine whether FFP needed per anesthesia.  Due to her continuous oozing, we attempted to get coags in the operating room, but she was a difficult stick and it was difficult to get blood through that, it was not completed until the patient went to PACU.     Pieter PartridgeEvelyn B Orlando Devereux, MD     EBV/MEDQ  D:  12/15/2017  T:  12/15/2017  Job:  [id number]

## 2017-12-15 NOTE — Progress Notes (Signed)
Patient ID: Michelle DownerCharisma M Mose, female   DOB: [birth year], 21 y.o.   MRN: [id number] Asked to see patient to help with repair. Repair was mildly oozing. Placed one figure of eight when bright red gushing noted. Uterus massaged for approx. 200 cc of clot. Uterus explored and clot and pieces of placenta removed. Patient was not comfortable enough for complete evacuation in room, though anesthesia came and dosed her epidural. VSS initially. Called for measured EBL and weight plus measure showed approx. 2000cc blood loss. Code hemorrhage called. Patient reported fatigue and had some tachycardia. She then dropped her BP. Care turned over to Dr. Dion BodyVarnado.

## 2017-12-15 NOTE — Anesthesia Postprocedure Evaluation (Signed)
Anesthesia Post Note  Patient: Michelle Santiago  Procedure(s) Performed: DILATATION AND CURETTAGE (N/A Uterus)     Patient location during evaluation: Women's Unit Anesthesia Type: MAC Level of consciousness: awake and alert and oriented Pain management: pain level controlled Respiratory status: spontaneous breathing and nonlabored ventilation Cardiovascular status: stable Postop Assessment: no headache, patient able to bend at knees, no backache, no apparent nausea or vomiting, epidural receding and adequate PO intake Anesthetic complications: no    Last Vitals:  Vitals:   12/15/17 0746 12/15/17 1148  BP: 105/70 109/73  Pulse: 86 71  Resp: 16 16  Temp: 36.9 C 37 C  SpO2: 99% 100%    Last Pain:  Vitals:   12/15/17 1342  TempSrc:   PainSc: 2    Pain Goal:                 [employer]

## 2017-12-15 NOTE — Progress Notes (Signed)
Dr. Sallye OberKulwa called and updated on patient labs abd VB. Per MD, foley may be removed and may consult anesthesia to get approval for epidural cath removal

## 2017-12-15 NOTE — Lactation Note (Addendum)
This note was copied from a baby's chart. Lactation Consultation Note  Patient Name: Michelle Santiago Reason for consult: Initial assessment;1st time breastfeeding;Other (Comment)(PPH 2000EBL)  Initial visit at 14 hours of age.  Mom reports good feedings, but wants LC to observe latch.  Mom reports recent feeding.  Mom is able to hand express drops of colostrum easily.  Mom attempted cross cradle hold at left breast with minimal assist. Baby is too sleepy to latch even after drops applied to mouth and remains STS with mom.  Mom aware bedside RN can [facility] and mom will call for assist  As needed.   LC discussed EBL of 2000 that could affect moms milk supply.  Mom was transfused with 4 units at time of surgical D & C.  LC encouraged mom to work on hand expression with collection container given and give all EBM to baby with finger or spoon.  Mom and LC discussed attempts to work on hand expression several times a day versus setting up DEBP at this time.   [facility] given and discussed.  Encouraged to feed with early cues on demand.  Early newborn behavior discussed.   Mom to call for assist as needed.     Maternal Data Has patient been taught Hand Expression?: Yes Does the patient have breastfeeding experience prior to this delivery?: No  Feeding Feeding Type: Breast Fed Length of feed: (recent feeding baby to sleepy for attempt)  LATCH Score Latch: Too sleepy or reluctant, no latch achieved, no sucking elicited.  Audible Swallowing: None  Type of Nipple: Flat(areola bulbous)  Comfort (Breast/Nipple): Soft / non-tender  Hold (Positioning): Assistance needed to correctly position infant at breast and maintain latch.  LATCH Score: 4  Interventions Interventions: Breast feeding basics reviewed;Adjust position;Assisted with latch;Support pillows;Skin to skin;Breast massage;Expressed milk;Hand express  Lactation Tools Discussed/Used      Consult Status Consult Status: Follow-up Date: 12/16/17 Follow-up type: In-patient    Michelle Santiago Santiago, 12:39 PM

## 2017-12-15 NOTE — Anesthesia Postprocedure Evaluation (Signed)
Anesthesia Post Note  Patient: Michelle Santiago  Procedure(s) Performed: AN AD HOC LABOR EPIDURAL     Patient location during evaluation: Women's Unit Anesthesia Type: Epidural Level of consciousness: awake and alert and oriented Pain management: pain level controlled Vital Signs Assessment: post-procedure vital signs reviewed and stable Respiratory status: spontaneous breathing and nonlabored ventilation Cardiovascular status: stable Postop Assessment: no headache, adequate PO intake, no backache, epidural receding, patient able to bend at knees and no apparent nausea or vomiting Anesthetic complications: no    Last Vitals:  Vitals:   12/15/17 0746 12/15/17 1148  BP: 105/70 109/73  Pulse: 86 71  Resp: 16 16  Temp: 36.9 C 37 C  SpO2: 99% 100%    Last Pain:  Vitals:   12/15/17 1342  TempSrc:   PainSc: 2    Pain Goal:                 Land O'LakesMalinova,Lynann Demetrius Hristova

## 2017-12-15 NOTE — Progress Notes (Signed)
0201: Review patient's chart for possible admission.

## 2017-12-15 NOTE — Brief Op Note (Addendum)
12/14/2017 - 12/15/2017  12:54 AM  PATIENT:  Michelle Santiago  21 y.o. female  PRE-OPERATIVE DIAGNOSIS:  Postpartum hemorrhage, retained placenta, Presumed chorioamnionitis POST-OPERATIVE DIAGNOSIS:  Same, severe anemia   PROCEDURE:  Procedure(s): DILATATION AND CURETTAGE (N/A) under ultrasound guidance Bakri ballon placement  SURGEON:  Surgeon(s) and Role:    Geryl Rankins* Cissy Galbreath, MD - Primary  PHYSICIAN ASSISTANT: None  ASSISTANTS: Lajuan LinesNancy Pethero, CNM   ANESTHESIA:   epidural  EBL:  498 mL  UOP 900 ml  BLOOD ADMINISTERED:2 units CC PRBC  DRAINS: Urinary Catheter (Foley) and Bakri ballon placed-expelled   LOCAL MEDICATIONS USED:  NONE  SPECIMEN:  Source of Specimen:  Retained placenta  DISPOSITION OF SPECIMEN:  PATHOLOGY  COUNTS:  YES  TOURNIQUET:  * No tourniquets in log *  DICTATION: .Other Dictation: Dictation Number [id number]  PLAN OF CARE: To PACU,   PATIENT DISPOSITION:  PACU - hemodynamically stable.   Delay start of Pharmacological VTE agent (>24hrs) due to surgical blood loss or risk of bleeding: yes

## 2017-12-16 LAB — RPR: RPR Ser Ql: NONREACTIVE

## 2017-12-16 MED ORDER — IBUPROFEN 600 MG PO TABS: 600.0000 mg | ORAL_TABLET | Freq: Four times a day (QID) | ORAL | 0 refills | Status: AC

## 2017-12-16 NOTE — Progress Notes (Signed)
Discharge instructions reviewed with patient.  Patient to remain in room 320 as baby is still a patient.

## 2017-12-16 NOTE — Discharge Summary (Signed)
OB Discharge Summary     Patient Name: Michelle DownerCharisma M Agramonte DOB: 08-05-1996 MRN: [id number]  Date of admission: 12/14/2017 Delivering MD: Kenney HousemanPROTHERO, NANCY JEAN   Date of discharge: 12/16/2017  Admitting diagnosis: 38WKS CTX Intrauterine pregnancy: 7767w0d     Secondary diagnosis:  Active Problems:   Normal labor SVD PPH Retained Placenta Chorio, Presumed Severe Anemia  Additional problems: None     Discharge diagnosis: Term Pregnancy Delivered                                                                                                Post partum procedures:blood transfusion  Augmentation: None  Complications: None  Hospital course:  Onset of Labor With Vaginal Delivery     21 y.o. yo G1P0 at 4467w0d was admitted in Active Labor on 12/14/2017. Patient had an uncomplicated labor course as follows:  Membrane Rupture Time/Date: 10:30 AM ,12/14/2017   Intrapartum Procedures: Episiotomy: None [1]                                         Lacerations:  2nd degree [3];Labial [10]  Patient had a delivery of a Viable infant. 12/14/2017  Information for the patient's newborn:  Dahlia ClientOllison, Girl Lakitha [[id number]][[id number]]  Delivery Method: Vag-Spont    Pateint had an uncomplicated postpartum course.  She is ambulating, tolerating a regular diet, passing flatus, and urinating well. Patient is discharged home in stable condition on 12/16/17.   Physical exam  Vitals:   12/15/17 1604 12/15/17 1900 12/16/17 0010 12/16/17 0354  BP: 125/77 120/72 117/60 118/73  Pulse: 89 80 64 66  Resp: 16 16 16 16   Temp: 97.8 F (36.6 C) 98.6 F (37 C) 98.4 F (36.9 C) 98.5 F (36.9 C)  TempSrc: Oral Oral Oral Oral  SpO2: 100% 100% 100% 100%  Weight:      Height:       General: alert, cooperative and no distress  Chest: HRRR, Lungs CTA Lochia: appropriate Uterine Fundus: firm at umbilicus Incision: N/A DVT Evaluation: No evidence of DVT seen on physical exam. No significant calf/ankle  edema. Labs: Lab Results  Component Value Date   WBC 24.6 (H) 12/15/2017   HGB 10.1 (L) 12/15/2017   HCT 29.2 (L) 12/15/2017   MCV 81.6 12/15/2017   PLT 152 12/15/2017   CMP Latest Ref Rng & Units 11/27/2012  Glucose 70 - 99 mg/dL 74  BUN 6 - 23 mg/dL 13  Creatinine 8.650.10 - 7.841.20 mg/dL 6.960.70  Sodium 295135 - 284145 mEq/L 140  Potassium 3.5 - 5.3 mEq/L 4.3  Chloride 96 - 112 mEq/L 105  CO2 19 - 32 mEq/L 26  Calcium 8.4 - 10.5 mg/dL 9.7  Total Protein 6.0 - 8.3 g/dL 7.2  Total Bilirubin 0.3 - 1.2 mg/dL 0.3  Alkaline Phos 47 - 119 U/L 113  AST 0 - 37 U/L 14  ALT 0 - 35 U/L 10    Discharge instruction: per After Visit Summary and "Baby and Me Booklet". Pain Management, Peri-Care,  Breastfeeding, Who and When to call for postpartum complications. Information Sheet(s) given PPD and BB, Iron Rich Diet, Care after VD   After visit meds:  Allergies as of 12/16/2017   No Known Allergies     Medication List    TAKE these medications   ibuprofen 600 MG tablet Commonly known as:  ADVIL,MOTRIN Take 1 tablet (600 mg total) by mouth every 6 (six) hours.   PRENATAL VITAMIN PO Take 1 tablet by mouth daily.       Diet: routine diet  Activity: Advance as tolerated. Pelvic rest for 6 weeks.   Outpatient follow up:6 weeks Follow up Appt:No future appointments. Follow up Visit:No Follow-up on file.  Postpartum contraception: Progesterone only pills  Newborn Data: Live born female  Birth Weight: 7 lb 5.5 oz (3331 g) APGAR: 9, 9  Newborn Delivery   Birth date/time:  12/14/2017 21:55:00 Delivery type:  Vaginal, Spontaneous     Baby Feeding: Breast Disposition:home with mother   12/16/2017 Cherre RobinsJessica L Lillyahna Hemberger, CNM

## 2017-12-16 NOTE — Discharge Instructions (Signed)
Iron-Rich Diet Iron is a mineral that helps your body to produce hemoglobin. Hemoglobin is a protein in your red blood cells that carries oxygen to your body's tissues. Eating too little iron may cause you to feel weak and tired, and it can increase your risk for infection. Eating enough iron is necessary for your body's metabolism, muscle function, and nervous system. Iron is naturally found in many foods. It can also be added to foods or fortified in foods. There are two types of dietary iron:  Heme iron. Heme iron is absorbed by the body more easily than nonheme iron. Heme iron is found in meat, poultry, and fish.  Nonheme iron. Nonheme iron is found in dietary supplements, iron-fortified grains, beans, and vegetables.  You may need to follow an iron-rich diet if:  You have been diagnosed with iron deficiency or iron-deficiency anemia.  You have a condition that prevents you from absorbing dietary iron, such as: ? Infection in your intestines. ? Celiac disease. This involves long-lasting (chronic) inflammation of your intestines.  You do not eat enough iron.  You eat a diet that is high in foods that impair iron absorption.  You have lost a lot of blood.  You have heavy bleeding during your menstrual cycle.  You are pregnant.  What is my plan? Your health care provider may help you to determine how much iron you need per day based on your condition. Generally, when a person consumes sufficient amounts of iron in the diet, the following iron needs are met:  Men. ? 66-61 years old: 11 mg per day. ? 16-18 years old: 8 mg per day.  Women. ? 50-5 years old: 15 mg per day. ? 47-2 years old: 18 mg per day. ? Over 43 years old: 8 mg per day. ? Pregnant women: 27 mg per day. ? Breastfeeding women: 9 mg per day.  What do I need to know about an iron-rich diet?  Eat fresh fruits and vegetables that are high in vitamin C along with foods that are high in iron. This will help  increase the amount of iron that your body absorbs from food, especially with foods containing nonheme iron. Foods that are high in vitamin C include oranges, peppers, tomatoes, and mango.  Take iron supplements only as directed by your health care provider. Overdose of iron can be life-threatening. If you were prescribed iron supplements, take them with orange juice or a vitamin C supplement.  Cook foods in pots and pans that are made from iron.  Eat nonheme iron-containing foods alongside foods that are high in heme iron. This helps to improve your iron absorption.  Certain foods and drinks contain compounds that impair iron absorption. Avoid eating these foods in the same meal as iron-rich foods or with iron supplements. These include: ? Coffee, black tea, and red wine. ? Milk, dairy products, and foods that are high in calcium. ? Beans, soybeans, and peas. ? Whole grains.  When eating foods that contain both nonheme iron and compounds that impair iron absorption, follow these tips to absorb iron better. ? Soak beans overnight before cooking. ? Soak whole grains overnight and drain them before using. ? Ferment flours before baking, such as using yeast in bread dough. What foods can I eat? Grains Iron-fortified breakfast cereal. Iron-fortified whole-wheat bread. Enriched rice. Sprouted grains. Vegetables Spinach. Potatoes with skin. Green peas. Broccoli. Red and green bell peppers. Fermented vegetables. Fruits Prunes. Raisins. Oranges. Strawberries. Mango. Grapefruit. Meats and Other Protein Sources  Beef liver. Oysters. Beef. Shrimp. Turkey. Chicken. Tuna. Sardines. Chickpeas. Nuts. Tofu. °Beverages °Tomato juice. Fresh orange juice. Prune juice. Hibiscus tea. Fortified instant breakfast shakes. °Condiments °Tahini. Fermented soy sauce. °Sweets and Desserts °Black-strap molasses. °Other °Wheat germ. °The items listed above may not be a complete list of recommended foods or beverages.  Contact your dietitian for more options. °What foods are not recommended? °Grains °Whole grains. Bran cereal. Bran flour. Oats. °Vegetables °Artichokes. Brussels sprouts. Kale. °Fruits °Blueberries. Raspberries. Strawberries. Figs. °Meats and Other Protein Sources °Soybeans. Products made from soy protein. °Dairy °Milk. Cream. Cheese. Yogurt. Cottage cheese. °Beverages °Coffee. Black tea. Red wine. °Sweets and Desserts °Cocoa. Chocolate. Ice cream. °Other °Basil. Oregano. Parsley. °The items listed above may not be a complete list of foods and beverages to avoid. Contact your dietitian for more information. °This information is not intended to replace advice given to you by your health care provider. Make sure you discuss any questions you have with your health care provider. °Document Released: 07/21/2005 Document Revised: 06/26/2016 Document Reviewed: 07/04/2014 °Elsevier Interactive Patient Education © 2018 Else[employer] Inc[employer] °Postpartum Depression and Baby Blues °The postpartum period begins right after the birth of a baby. During this time, there is often a great amount of joy and excitement. It is also a time of many changes in the life of the parents. Regardless of how many times a mother gives birth, each child brings new challenges and dynamics to the family. It is not unusual to have feelings of excitement along with confusing shifts in moods, emotions, and thoughts. All mothers are at risk of developing postpartum depression or the "baby blues." These mood changes can occur right after giving birth, or they may occur many months after giving birth. The baby blues or postpartum depression can be mild or severe. Additionally, postpartum depression can go away rather quickly, or it can be a long-term condition. °What are the causes? °Raised hormone levels and the rapid drop in those levels are thought to be a main cause of postpartum depression and the baby blues. A number of hormones change during and after  pregnancy. Estrogen and progesterone usually decrease right after the delivery of your baby. The levels of thyroid hormone and various cortisol steroids also rapidly drop. Other factors that play a role in these mood changes include major life events and genetics. °What increases the risk? °If you have any of the following risks for the baby blues or postpartum depression, know what symptoms to watch out for during the postpartum period. Risk factors that may increase the likelihood of getting the baby blues or postpartum depression include: °· Having a personal or family history of depression. °· Having depression while being pregnant. °· Having premenstrual mood issues or mood issues related to oral contraceptives. °· Having a lot of life stress. °· Having marital conflict. °· Lacking a social support network. °· Having a baby with special needs. °· Having health problems, such as diabetes. ° °What are the signs or symptoms? °Symptoms of baby blues include: °· Brief changes in mood, such as going from extreme happiness to sadness. °· Decreased concentration. °· Difficulty sleeping. °· Crying spells, tearfulness. °· Irritability. °· Anxiety. ° °Symptoms of postpartum depression typically begin within the first month after giving birth. These symptoms include: °· Difficulty sleeping or excessive sleepiness. °· Marked weight loss. °· Agitation. °· Feelings of worthlessness. °· Lack of interest in activity or food. ° °Postpartum psychosis is a very serious condition and can be dangerous. Fortunately, it   is rare. Displaying any of the following symptoms is cause for immediate medical attention. Symptoms of postpartum psychosis include:  Hallucinations and delusions.  Bizarre or disorganized behavior.  Confusion or disorientation.  How is this diagnosed? A diagnosis is made by an evaluation of your symptoms. There are no medical or lab tests that lead to a diagnosis, but there are various questionnaires that a  health care provider may use to identify those with the baby blues, postpartum depression, or psychosis. Often, a screening tool called the [place] Postnatal Depression Scale is used to diagnose depression in the postpartum period. How is this treated? The baby blues usually goes away on its own in 1-2 weeks. Social support is often all that is needed. You will be encouraged to get adequate sleep and rest. Occasionally, you may be given medicines to help you sleep. Postpartum depression requires treatment because it can last several months or longer if it is not treated. Treatment may include individual or group therapy, medicine, or both to address any social, physiological, and psychological factors that may play a role in the depression. Regular exercise, a healthy diet, rest, and social support may also be strongly recommended. Postpartum psychosis is more serious and needs treatment right away. Hospitalization is often needed. Follow these instructions at home:  Get as much rest as you can. Nap when the baby sleeps.  Exercise regularly. Some women find yoga and walking to be beneficial.  Eat a balanced and nourishing diet.  Do little things that you enjoy. Have a cup of tea, take a bubble bath, read your favorite magazine, or listen to your favorite music.  Avoid alcohol.  Ask for help with household chores, cooking, grocery shopping, or running errands as needed. Do not try to do everything.  Talk to people close to you about how you are feeling. Get support from your partner, family members, friends, or other new moms.  Try to stay positive in how you think. Think about the things you are grateful for.  Do not spend a lot of time alone.  Only take over-the-counter or prescription medicine as directed by your health care provider.  Keep all your postpartum appointments.  Let your health care provider know if you have any concerns. Contact a health care provider if: You are  having a reaction to or problems with your medicine. Get help right away if:  You have suicidal feelings.  You think you may harm the baby or someone else. This information is not intended to replace advice given to you by your health care provider. Make sure you discuss any questions you have with your health care provider. Document Released: 09/10/2004 Document Revised: 05/14/2016 Document Reviewed: 09/18/2013 Elsevier Interactive Patient Education  2017 Elsevier Inc.   Vaginal Delivery, Care After Refer to this sheet in the next few weeks. These instructions provide you with information about caring for yourself after vaginal delivery. Your health care provider may also give you more specific instructions. Your treatment has been planned according to current medical practices, but problems sometimes occur. Call your health care provider if you have any problems or questions. What can I expect after the procedure? After vaginal delivery, it is common to have:  Some bleeding from your vagina.  Soreness in your abdomen, your vagina, and the area of skin between your vaginal opening and your anus (perineum).  Pelvic cramps.  Fatigue.  Follow these instructions at home: Medicines  Take over-the-counter and prescription medicines only as told by your  health care provider.  If you were prescribed an antibiotic medicine, take it as told by your health care provider. Do not stop taking the antibiotic until it is finished. Driving   Do not drive or operate heavy machinery while taking prescription pain medicine.  Do not drive for 24 hours if you received a sedative. Lifestyle  Do not drink alcohol. This is especially important if you are breastfeeding or taking medicine to relieve pain.  Do not use tobacco products, including cigarettes, chewing tobacco, or e-cigarettes. If you need help quitting, ask your health care provider. Eating and drinking  Drink at least 8 eight-ounce  glasses of water every day unless you are told not to by your health care provider. If you choose to breastfeed your baby, you may need to drink more water than this.  Eat high-fiber foods every day. These foods may help prevent or relieve constipation. High-fiber foods include: ? Whole grain cereals and breads. ? Brown rice. ? Beans. ? Fresh fruits and vegetables. Activity  Return to your normal activities as told by your health care provider. Ask your health care provider what activities are safe for you.  Rest as much as possible. Try to rest or take a nap when your baby is sleeping.  Do not lift anything that is heavier than your baby or 10 lb (4.5 kg) until your health care provider says that it is safe.  Talk with your health care provider about when you can engage in sexual activity. This may depend on your: ? Risk of infection. ? Rate of healing. ? Comfort and desire to engage in sexual activity. Vaginal Care  If you have an episiotomy or a vaginal tear, check the area every day for signs of infection. Check for: ? More redness, swelling, or pain. ? More fluid or blood. ? Warmth. ? Pus or a bad smell.  Do not use tampons or douches until your health care provider says this is safe.  Watch for any blood clots that may pass from your vagina. These may look like clumps of dark red, brown, or black discharge. General instructions  Keep your perineum clean and dry as told by your health care provider.  Wear loose, comfortable clothing.  Wipe from front to back when you use the toilet.  Ask your health care provider if you can shower or take a bath. If you had an episiotomy or a perineal tear during labor and delivery, your health care provider may tell you not to take baths for a certain length of time.  Wear a bra that supports your breasts and fits you well.  If possible, have someone help you with household activities and help care for your baby for at least a few days  after you leave the hospital.  Keep all follow-up visits for you and your baby as told by your health care provider. This is important. Contact a health care provider if:  You have: ? Vaginal discharge that has a bad smell. ? Difficulty urinating. ? Pain when urinating. ? A sudden increase or decrease in the frequency of your bowel movements. ? More redness, swelling, or pain around your episiotomy or vaginal tear. ? More fluid or blood coming from your episiotomy or vaginal tear. ? Pus or a bad smell coming from your episiotomy or vaginal tear. ? A fever. ? A rash. ? Little or no interest in activities you used to enjoy. ? Questions about caring for yourself or your baby.  Your  episiotomy or vaginal tear feels warm to the touch.  Your episiotomy or vaginal tear is separating or does not appear to be healing.  Your breasts are painful, hard, or turn red.  You feel unusually sad or worried.  You feel nauseous or you vomit.  You pass large blood clots from your vagina. If you pass a blood clot from your vagina, save it to show to your health care provider. Do not flush blood clots down the toilet without having your health care provider look at them.  You urinate more than usual.  You are dizzy or light-headed.  You have not breastfed at all and you have not had a menstrual period for 12 weeks after delivery.  You have stopped breastfeeding and you have not had a menstrual period for 12 weeks after you stopped breastfeeding. Get help right away if:  You have: ? Pain that does not go away or does not get better with medicine. ? Chest pain. ? Difficulty breathing. ? Blurred vision or spots in your vision. ? Thoughts about hurting yourself or your baby.  You develop pain in your abdomen or in one of your legs.  You develop a severe headache.  You faint.  You bleed from your vagina so much that you fill two sanitary pads in one hour. This information is not intended to  replace advice given to you by your health care provider. Make sure you discuss any questions you have with your health care provider. Document Released: 12/04/2000 Document Revised: 05/20/2016 Document Reviewed: 12/22/2015 Elsevier Interactive Patient Education  2018 [employer].

## 2017-12-16 NOTE — Plan of Care (Signed)
Pt. Will have positive interaction with new born

## 2017-12-16 NOTE — Plan of Care (Signed)
Pt will cont. To increase activity level

## 2017-12-16 NOTE — Lactation Note (Signed)
This note was copied from a baby's chart. Lactation Consultation Note: Follow up visit with mom. She reports baby has been nursing well through the night. Baby showing feeding cues so offered assist with latch and mom agreeable. Mom easily able to hand express Colostrum. Baby latched well in football hold. Encouraged keeping baby close to the breast throughout the feeding and breast compression while she is nursing. Reviewed engorgement prevention and treatment. Has [facility] DE[facility] for home. No questions at present. Reviewed our phone number, OP appointments and BFGS as resources for support after DC. To call prn Patient Name: Michelle Lilly CoveCharisma Santiago ZOXWR'UToday's Date: 12/16/2017 Reason for consult: Follow-up assessment   Maternal Data Formula Feeding for Exclusion: No Has patient been taught Hand Expression?: Yes Does the patient have breastfeeding experience prior to this delivery?: No  Feeding Feeding Type: Breast Fed Length of feed: 20 min  LATCH Score Latch: Grasps breast easily, tongue down, lips flanged, rhythmical sucking.  Audible Swallowing: A few with stimulation  Type of Nipple: Everted at rest and after stimulation  Comfort (Breast/Nipple): Soft / non-tender  Hold (Positioning): Assistance needed to correctly position infant at breast and maintain latch.  LATCH Score: 8  Interventions Interventions: Breast feeding basics reviewed;Hand express;Breast compression  Lactation Tools Discussed/Used WIC Program: No   Consult Status Consult Status: Complete    Pamelia HoitWeeks, Baley Shands D 12/16/2017, 7:54 AM

## 2017-12-17 ENCOUNTER — Ambulatory Visit: Payer: Self-pay

## 2017-12-17 LAB — TYPE AND SCREEN
ABO/RH(D): AB POS
ANTIBODY SCREEN: NEGATIVE
UNIT DIVISION: 0
UNIT DIVISION: 0
Unit division: 0
Unit division: 0
Unit division: 0
Unit division: 0

## 2017-12-17 LAB — BPAM RBC
BLOOD PRODUCT EXPIRATION DATE: 201901022359
BLOOD PRODUCT EXPIRATION DATE: 201901082359
Blood Product Expiration Date: 201812302359
Blood Product Expiration Date: 201901022359
Blood Product Expiration Date: 201901082359
Blood Product Expiration Date: 201901132359
ISSUE DATE / TIME: 201812260059
ISSUE DATE / TIME: 201812252321
ISSUE DATE / TIME: 201812252321
ISSUE DATE / TIME: 201812260148
UNIT TYPE AND RH: 5100
UNIT TYPE AND RH: 600
UNIT TYPE AND RH: 600
UNIT TYPE AND RH: 9500
Unit Type and Rh: 5100
Unit Type and Rh: 6200

## 2017-12-17 NOTE — Lactation Note (Signed)
This note was copied from a baby's chart. Lactation Consultation Note; Called by staff to see mom again as dad has questions about giving formula while breast feeding. Mom states she plans to breast feed and does not want to give formula. Dad asking about mixing breast milk and formula together. Encouraged to breast feed or give EBM in bottle, then give formula is baby is still hungry and no EBM available. Reviewed benefits of breast feeding with dad. Asking about WIC- has not signed up yet. Phone number given to dad at his request. Mom has [employer] for home. No further questions at present. To call prn  Patient Name: Michelle Santiago ZOXWR'UToday's Date: 12/17/2017 Reason for consult: Follow-up assessment   Maternal Data Formula Feeding for Exclusion: No Has patient been taught Hand Expression?: Yes Does the patient have breastfeeding experience prior to this delivery?: No  Feeding Feeding Type: Breast Fed Length of feed: 40 min  LATCH Score Latch: Grasps breast easily, tongue down, lips flanged, rhythmical sucking.  Audible Swallowing: A few with stimulation  Type of Nipple: Everted at rest and after stimulation  Comfort (Breast/Nipple): Soft / non-tender  Hold (Positioning): Assistance needed to correctly position infant at breast and maintain latch.  LATCH Score: 8  Interventions    Lactation Tools Discussed/Used     Consult Status Consult Status: Complete    Pamelia HoitWeeks, Jamilya Sarrazin D 12/17/2017, 9:58 AM

## 2017-12-17 NOTE — Lactation Note (Signed)
This note was copied from a baby's chart. Lactation Consultation Note: Mom sleeping with baby in bed. I placed baby in bassinet. Mom woke- reports baby is nursing well and breasts are feeling fuller this morning. No questions at present. Reviewed our phone number, OP appointments and BFSG as resources for support after DC.   Patient Name: Michelle Santiago ZOXWR'UToday's Date: 12/17/2017 Reason for consult: Follow-up assessment   Maternal Data Formula Feeding for Exclusion: No Has patient been taught Hand Expression?: Yes Does the patient have breastfeeding experience prior to this delivery?: No  Feeding Feeding Type: Breast Fed Length of feed: 55 min  LATCH Score Latch: Grasps breast easily, tongue down, lips flanged, rhythmical sucking.  Audible Swallowing: Spontaneous and intermittent  Type of Nipple: Everted at rest and after stimulation  Comfort (Breast/Nipple): Soft / non-tender  Hold (Positioning): No assistance needed to correctly position infant at breast.  LATCH Score: 10  Interventions    Lactation Tools Discussed/Used     Consult Status Consult Status: Complete    Pamelia HoitWeeks, Preslee Regas D 12/17/2017, 7:45 AM

## 2017-12-30 NOTE — Anesthesia Postprocedure Evaluation (Signed)
Anesthesia Post Note  Patient: Syona M Ryner  Procedure(s) Performed: DILATATION AND CURETTAGE (N/A Uterus)     Patient location during evaluation: PACU Anesthesia Type: MAC and Epidural Level of consciousness: awake and sedated Pain management: pain level controlled Vital Signs Assessment: post-procedure vital signs reviewed and stable Respiratory status: spontaneous breathing Cardiovascular status: stable Postop Assessment: no headache, no backache, epidural receding, patient able to bend at knees and no apparent nausea or vomiting Anesthetic complications: no    Last Vitals:  Vitals:   12/16/17 0354 12/16/17 0809  BP: 118/73 115/72  Pulse: 66 75  Resp: 16 16  Temp: 36.9 C 36.9 C  SpO2: 100% 100%    Last Pain:  Vitals:   12/16/17 0900  TempSrc:   PainSc: 0-No pain   Pain Goal:                 Eathan Groman JR,JOHN Kerryn Tennant

## 2018-03-17 ENCOUNTER — Encounter (HOSPITAL_COMMUNITY): Payer: Self-pay
